# Patient Record
Sex: Female | Born: 1990 | Race: Black or African American | Hispanic: No | Marital: Single | Smoking: Never smoker
Health system: Southern US, Community
[De-identification: ages and names within clinical notes are randomized; demographics above are authoritative.]

---

## 2018-03-31 ENCOUNTER — Emergency Department
Admission: EM | Admit: 2018-03-31 | Discharge: 2018-03-31 | Disposition: A | Discharge status: Home / Self Care | Payer: BLUE CROSS/BLUE SHIELD | Attending: Emergency Medicine | Admitting: Emergency Medicine

## 2018-03-31 ENCOUNTER — Encounter: Payer: Self-pay | Admitting: Emergency Medicine

## 2018-03-31 ENCOUNTER — Emergency Department: Payer: BLUE CROSS/BLUE SHIELD

## 2018-03-31 ENCOUNTER — Other Ambulatory Visit: Payer: Self-pay

## 2018-03-31 DIAGNOSIS — W228XXA Striking against or struck by other objects, initial encounter: Secondary | ICD-10-CM | POA: Diagnosis not present

## 2018-03-31 DIAGNOSIS — Y939 Activity, unspecified: Secondary | ICD-10-CM | POA: Insufficient documentation

## 2018-03-31 DIAGNOSIS — S90122A Contusion of left lesser toe(s) without damage to nail, initial encounter: Secondary | ICD-10-CM | POA: Insufficient documentation

## 2018-03-31 DIAGNOSIS — Y929 Unspecified place or not applicable: Secondary | ICD-10-CM | POA: Diagnosis not present

## 2018-03-31 DIAGNOSIS — Y999 Unspecified external cause status: Secondary | ICD-10-CM | POA: Diagnosis not present

## 2018-03-31 DIAGNOSIS — S99922A Unspecified injury of left foot, initial encounter: Secondary | ICD-10-CM | POA: Diagnosis present

## 2018-03-31 NOTE — ED Provider Notes (Signed)
[facility] Emergency Department Provider Note   ____________________________________________   First MD Initiated Contact with Patient 03/31/18 1344     (approximate)  I have reviewed the triage vital signs and the nursing notes.   HISTORY  Chief Complaint Toe Pain    HPI Jeanette Santiago is a 26 y.o. female patient complain of left foot pain after dropping a large cup onto the foot.  History reviewed. No pertinent past medical history.  There are no active problems to display for this patient.   History reviewed. No pertinent surgical history.  Prior to Admission medications   Not on File    Allergies Patient has no known allergies.  No family history on file.  Social History Social History   Tobacco Use  . Smoking status: Never Smoker  . Smokeless tobacco: Never Used  Substance Use Topics  . Alcohol use: Yes  . Drug use: Never    Review of Systems Constitutional: No fever/chills Eyes: No visual changes. ENT: No sore throat. Cardiovascular: Denies chest pain. Respiratory: Denies shortness of breath. Gastrointestinal: No abdominal pain.  No nausea, no vomiting.  No diarrhea.  No constipation. Genitourinary: Negative for dysuria. Musculoskeletal: Left toe pain. Skin: Negative for rash. Neurological: Negative for headaches, focal weakness or numbness.   ____________________________________________   PHYSICAL EXAM:  VITAL SIGNS: ED Triage Vitals  Enc Vitals Group     BP 03/31/18 1337 132/60     Pulse Rate 03/31/18 1337 (!) 101     Resp 03/31/18 1337 14     Temp 03/31/18 1337 98.2 F (36.8 C)     Temp Source 03/31/18 1337 Oral     SpO2 03/31/18 1337 100 %     Weight 03/31/18 1336 207 lb (93.9 kg)     Height 03/31/18 1336 5\' 2"  (1.575 m)     Head Circumference --      Peak Flow --      Pain Score 03/31/18 1340 9     Pain Loc --      Pain Edu? --      Excl. in GC? --    Constitutional: Alert and oriented. Well  appearing and in no acute distress. Cardiovascular: Normal rate, regular rhythm. Grossly normal heart sounds.  Good peripheral circulation. Respiratory: Normal respiratory effort.  No retractions. Lungs CTAB. Musculoskeletal: No obvious deformity to the second toe of the left foot.  Patient has moderate guarding palpation. Neurologic:  Normal speech and language. No gross focal neurologic deficits are appreciated. No gait instability. Skin:  Skin is warm, dry and intact. No rash noted. Psychiatric: Mood and affect are normal. Speech and behavior are normal.  ____________________________________________   LABS (all labs ordered are listed, but only abnormal results are displayed)  Labs Reviewed - No data to display ____________________________________________  EKG   ____________________________________________  RADIOLOGY  ED MD interpretation:    Official radiology report(s): Dg Toe 2nd Left  Result Date: 03/31/2018 CLINICAL DATA:  Pt reports dropped canteen on 2nd and 3rd toe on left foot. Generalized pain/swelling to left 2nd digit. EXAM: LEFT SECOND TOE COMPARISON:  None. FINDINGS: There is no evidence of fracture or dislocation. There is no evidence of arthropathy or other focal bone abnormality. No radiodense foreign body. Soft tissues are unremarkable. IMPRESSION: Negative. Electronically Signed   By: Corlis Leak M.D.   On: 03/31/2018 14:29    ____________________________________________   PROCEDURES  Procedure(s) performed: None  Procedures  Critical Care performed: No  ____________________________________________  INITIAL IMPRESSION / ASSESSMENT AND PLAN / ED COURSE  As part of my medical decision making, I reviewed the following data within the electronic MEDICAL RECORD NUMBER    Left second toe pain secondary to contusion.  Discussed negative x-ray findings with patient.  Patient placed an open shoe and advised use over-the-counter ibuprofen or naproxen.   Follow-up PCP as needed.      ____________________________________________   FINAL CLINICAL IMPRESSION(S) / ED DIAGNOSES  Final diagnoses:  Contusion of left lesser toe(s) w/o damage to nail, init     ED Discharge Orders    None       Note:  This document was prepared using Dragon voice recognition software and may include unintentional dictation errors.    Joni Reining, PA-C 03/31/18 1433    Phineas Semen, MD 04/01/18 [phone]

## 2018-03-31 NOTE — ED Notes (Addendum)
Pt  Reports dropped  canteen on 2 and 3  Rd toe l foot   With some pain / swelling  Of the affected area

## 2018-03-31 NOTE — Discharge Instructions (Addendum)
Wear open shoe or shoe for comfort for 3 to 5 days.  Take over-the-counter ibuprofen or naproxen.

## 2018-03-31 NOTE — ED Triage Notes (Signed)
Pt to ed with c/o left foot toe pain after dropping a large cup onto foot and toe area.

## 2018-03-31 NOTE — ED Notes (Signed)
Female large ortho shoe applied

## 2019-12-02 HISTORY — PX: GASTRIC BYPASS: SHX52

## 2021-04-03 ENCOUNTER — Emergency Department: Payer: 59

## 2021-04-03 ENCOUNTER — Other Ambulatory Visit: Payer: Self-pay

## 2021-04-03 ENCOUNTER — Emergency Department
Admission: EM | Admit: 2021-04-03 | Discharge: 2021-04-03 | Disposition: A | Discharge status: Home / Self Care | Payer: 59 | Attending: Emergency Medicine | Admitting: Emergency Medicine

## 2021-04-03 DIAGNOSIS — Y92410 Unspecified street and highway as the place of occurrence of the external cause: Secondary | ICD-10-CM | POA: Insufficient documentation

## 2021-04-03 DIAGNOSIS — S52615A Nondisplaced fracture of left ulna styloid process, initial encounter for closed fracture: Secondary | ICD-10-CM | POA: Diagnosis not present

## 2021-04-03 DIAGNOSIS — S8002XA Contusion of left knee, initial encounter: Secondary | ICD-10-CM | POA: Diagnosis not present

## 2021-04-03 DIAGNOSIS — S52502A Unspecified fracture of the lower end of left radius, initial encounter for closed fracture: Secondary | ICD-10-CM | POA: Diagnosis not present

## 2021-04-03 DIAGNOSIS — S62102A Fracture of unspecified carpal bone, left wrist, initial encounter for closed fracture: Secondary | ICD-10-CM

## 2021-04-03 DIAGNOSIS — S6992XA Unspecified injury of left wrist, hand and finger(s), initial encounter: Secondary | ICD-10-CM | POA: Diagnosis present

## 2021-04-03 MED ORDER — NAPROXEN 500 MG PO TABS: 500.0000 mg | ORAL_TABLET | Freq: Two times a day (BID) | ORAL | Status: AC

## 2021-04-03 MED ORDER — ORPHENADRINE CITRATE ER 100 MG PO TB12: 100.0000 mg | ORAL_TABLET | Freq: Two times a day (BID) | ORAL | 0 refills | Status: AC

## 2021-04-03 MED ORDER — OXYCODONE-ACETAMINOPHEN 7.5-325 MG PO TABS: 1.0000 | ORAL_TABLET | Freq: Four times a day (QID) | ORAL | 0 refills | Status: AC | PRN

## 2021-04-03 MED ORDER — OXYCODONE-ACETAMINOPHEN 5-325 MG PO TABS
1.0000 | ORAL_TABLET | Freq: Once | ORAL | Status: AC
  Administered 2021-04-03: 1 via ORAL
  Filled 2021-04-03: qty 1

## 2021-04-03 MED ORDER — CYCLOBENZAPRINE HCL 10 MG PO TABS
10.0000 mg | ORAL_TABLET | Freq: Once | ORAL | Status: AC
  Administered 2021-04-03: 10 mg via ORAL
  Filled 2021-04-03: qty 1

## 2021-04-03 MED ORDER — IBUPROFEN 600 MG PO TABS
600.0000 mg | ORAL_TABLET | Freq: Once | ORAL | Status: AC
  Administered 2021-04-03: 600 mg via ORAL
  Filled 2021-04-03: qty 1
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
## 2021-04-03 NOTE — ED Notes (Signed)
[facility] volar splint applied to L wrist by Council Mechanic, EMT. Splint approved by Ron PA. Pt ambulatory out of ED.

## 2021-04-03 NOTE — Discharge Instructions (Addendum)
Reason for discharge care instructions.  Take medication as directed.  Call the orthopedic clinic listed in your discharge care instructions to schedule appointment on Monday morning.  Tell them you are a follow-up from the emergency room.

## 2021-04-03 NOTE — ED Notes (Signed)
Portable Xray at bedside.
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
## 2021-04-11 ENCOUNTER — Other Ambulatory Visit: Payer: Self-pay | Admitting: Physician Assistant

## 2021-05-25 ENCOUNTER — Other Ambulatory Visit: Payer: Self-pay | Admitting: Physician Assistant

## 2021-05-25 DIAGNOSIS — S52515D Nondisplaced fracture of left radial styloid process, subsequent encounter for closed fracture with routine healing: Secondary | ICD-10-CM

## 2021-05-28 ENCOUNTER — Other Ambulatory Visit: Payer: Self-pay

## 2021-05-28 ENCOUNTER — Ambulatory Visit
Admission: RE | Admit: 2021-05-28 | Discharge: 2021-05-28 | Disposition: A | Discharge status: Home / Self Care | Payer: 59 | Source: Ambulatory Visit | Attending: Physician Assistant | Admitting: Physician Assistant

## 2021-05-28 DIAGNOSIS — S52515D Nondisplaced fracture of left radial styloid process, subsequent encounter for closed fracture with routine healing: Secondary | ICD-10-CM | POA: Diagnosis present

## 2021-06-08 ENCOUNTER — Encounter: Payer: Self-pay | Admitting: Occupational Therapy

## 2021-06-08 ENCOUNTER — Ambulatory Visit: Payer: 59 | Attending: Physician Assistant | Admitting: Occupational Therapy

## 2021-06-08 DIAGNOSIS — M25642 Stiffness of left hand, not elsewhere classified: Secondary | ICD-10-CM | POA: Insufficient documentation

## 2021-06-08 DIAGNOSIS — M79642 Pain in left hand: Secondary | ICD-10-CM | POA: Insufficient documentation

## 2021-06-08 DIAGNOSIS — M25632 Stiffness of left wrist, not elsewhere classified: Secondary | ICD-10-CM | POA: Insufficient documentation

## 2021-06-08 DIAGNOSIS — M25532 Pain in left wrist: Secondary | ICD-10-CM | POA: Insufficient documentation

## 2021-06-08 DIAGNOSIS — M6281 Muscle weakness (generalized): Secondary | ICD-10-CM | POA: Insufficient documentation

## 2021-06-16 ENCOUNTER — Ambulatory Visit: Payer: 59 | Admitting: Occupational Therapy
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
## 2021-06-22 ENCOUNTER — Encounter: Payer: Self-pay | Admitting: Occupational Therapy

## 2021-06-22 ENCOUNTER — Ambulatory Visit: Payer: Self-pay | Attending: Physician Assistant | Admitting: Occupational Therapy

## 2021-06-22 DIAGNOSIS — M25642 Stiffness of left hand, not elsewhere classified: Secondary | ICD-10-CM | POA: Insufficient documentation

## 2021-06-22 DIAGNOSIS — M79642 Pain in left hand: Secondary | ICD-10-CM | POA: Insufficient documentation

## 2021-06-22 DIAGNOSIS — M6281 Muscle weakness (generalized): Secondary | ICD-10-CM | POA: Insufficient documentation

## 2021-06-22 DIAGNOSIS — M25532 Pain in left wrist: Secondary | ICD-10-CM | POA: Insufficient documentation

## 2021-06-22 DIAGNOSIS — M25632 Stiffness of left wrist, not elsewhere classified: Secondary | ICD-10-CM | POA: Insufficient documentation
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
## 2021-06-24 ENCOUNTER — Other Ambulatory Visit: Payer: Self-pay

## 2021-06-24 ENCOUNTER — Ambulatory Visit: Payer: Self-pay | Admitting: Occupational Therapy

## 2021-06-24 DIAGNOSIS — M79642 Pain in left hand: Secondary | ICD-10-CM

## 2021-06-24 DIAGNOSIS — M25642 Stiffness of left hand, not elsewhere classified: Secondary | ICD-10-CM

## 2021-06-24 DIAGNOSIS — M6281 Muscle weakness (generalized): Secondary | ICD-10-CM

## 2021-06-24 DIAGNOSIS — M25532 Pain in left wrist: Secondary | ICD-10-CM

## 2021-06-24 DIAGNOSIS — M25632 Stiffness of left wrist, not elsewhere classified: Secondary | ICD-10-CM

## 2021-06-25 ENCOUNTER — Encounter: Payer: Self-pay | Admitting: Occupational Therapy
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
## 2021-07-05 ENCOUNTER — Encounter: Payer: 59 | Admitting: Occupational Therapy

## 2021-07-06 ENCOUNTER — Ambulatory Visit: Payer: Self-pay | Admitting: Occupational Therapy

## 2021-07-12 ENCOUNTER — Ambulatory Visit: Payer: Self-pay | Admitting: Occupational Therapy

## 2021-07-15 ENCOUNTER — Ambulatory Visit: Payer: Self-pay | Admitting: Occupational Therapy

## 2021-07-19 ENCOUNTER — Other Ambulatory Visit: Payer: Self-pay

## 2021-07-19 ENCOUNTER — Ambulatory Visit: Payer: Self-pay | Admitting: Occupational Therapy

## 2021-07-19 DIAGNOSIS — M25532 Pain in left wrist: Secondary | ICD-10-CM

## 2021-07-19 DIAGNOSIS — M6281 Muscle weakness (generalized): Secondary | ICD-10-CM

## 2021-07-19 DIAGNOSIS — M25632 Stiffness of left wrist, not elsewhere classified: Secondary | ICD-10-CM
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
## 2021-07-21 ENCOUNTER — Ambulatory Visit: Payer: Self-pay | Admitting: Occupational Therapy

## 2021-07-22 ENCOUNTER — Ambulatory Visit: Payer: Self-pay | Admitting: Occupational Therapy

## 2021-07-26 ENCOUNTER — Other Ambulatory Visit: Payer: Self-pay

## 2021-07-26 ENCOUNTER — Ambulatory Visit: Payer: Self-pay | Attending: Physician Assistant | Admitting: Occupational Therapy

## 2021-07-26 DIAGNOSIS — M25642 Stiffness of left hand, not elsewhere classified: Secondary | ICD-10-CM | POA: Insufficient documentation

## 2021-07-26 DIAGNOSIS — M25632 Stiffness of left wrist, not elsewhere classified: Secondary | ICD-10-CM | POA: Insufficient documentation

## 2021-07-26 DIAGNOSIS — M25532 Pain in left wrist: Secondary | ICD-10-CM | POA: Insufficient documentation

## 2021-07-26 DIAGNOSIS — M6281 Muscle weakness (generalized): Secondary | ICD-10-CM | POA: Insufficient documentation

## 2021-07-26 DIAGNOSIS — M79642 Pain in left hand: Secondary | ICD-10-CM | POA: Insufficient documentation
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
## 2021-08-09 ENCOUNTER — Ambulatory Visit: Payer: Self-pay | Admitting: Occupational Therapy

## 2021-08-10 ENCOUNTER — Ambulatory Visit: Payer: Self-pay | Admitting: Occupational Therapy

## 2021-08-10 ENCOUNTER — Other Ambulatory Visit: Payer: Self-pay

## 2021-08-10 DIAGNOSIS — M25532 Pain in left wrist: Secondary | ICD-10-CM

## 2021-08-10 DIAGNOSIS — M79642 Pain in left hand: Secondary | ICD-10-CM

## 2021-08-10 DIAGNOSIS — M6281 Muscle weakness (generalized): Secondary | ICD-10-CM

## 2021-08-10 DIAGNOSIS — M25642 Stiffness of left hand, not elsewhere classified: Secondary | ICD-10-CM

## 2021-08-10 DIAGNOSIS — M25632 Stiffness of left wrist, not elsewhere classified: Secondary | ICD-10-CM
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
